# Patient Record
Sex: Female | Born: 1955 | Race: White | Marital: Married | Smoking: Former smoker
Health system: Southern US, Community
[De-identification: ages and names within clinical notes are randomized; demographics above are authoritative.]

## PROBLEM LIST (undated history)

## (undated) DIAGNOSIS — I10 Essential (primary) hypertension: Secondary | ICD-10-CM

## (undated) DIAGNOSIS — Z87442 Personal history of urinary calculi: Secondary | ICD-10-CM

## (undated) DIAGNOSIS — T148XXA Other injury of unspecified body region, initial encounter: Secondary | ICD-10-CM

## (undated) DIAGNOSIS — E039 Hypothyroidism, unspecified: Secondary | ICD-10-CM

## (undated) HISTORY — PX: CERVICAL SPINE SURGERY: SHX589

## (undated) HISTORY — PX: BACK SURGERY: SHX140

## (undated) HISTORY — PX: CHOLECYSTECTOMY: SHX55

## (undated) HISTORY — PX: BREAST BIOPSY: SHX20

## (undated) HISTORY — PX: ABDOMINAL HYSTERECTOMY: SHX81

---

## 2015-05-04 ENCOUNTER — Telehealth: Payer: Self-pay | Admitting: *Deleted

## 2015-05-05 NOTE — Telephone Encounter (Signed)
I called the patient and LVM advising she can take her gabapentin before EMG/NCV tomorrow.

## 2015-05-06 ENCOUNTER — Encounter: Payer: Managed Care, Other (non HMO) | Admitting: Neurology

## 2015-05-06 ENCOUNTER — Telehealth: Payer: Self-pay | Admitting: Neurology

## 2015-05-06 NOTE — Telephone Encounter (Signed)
I received a 3 way call from pt and [employer] today. Pt is scheduled for NCS and EMG at 4p today. Nancy Sims called from [place] with pt Nancy Sims on the phone. Nancy Sims, new pt,  was scheduled for ncs and emg and was told  she needed to call her insurance to verify with them what would be covered, by me when she was scheduled. Nancy Sims states she called our office and asked that our office fill out a predetermination form. She was called back by [facility] who told her she looked at the insurances web site and was under the impression she did not need to be pre-qualified for procedures. Nancy Sims did not know what a predetermination form was. While on the phone with Nancy Sims and Nancy Sims it was told that the pt should have called her referring physician to have the predetermination form filled out. Nancy Sims was told, by insurance, not all of the EMG would be covered with the diagnosis codes that were provided during her original referral. [employer] then stated that the referring physician could go online to a list of pre-qualified diagnosis codes. Nancy Sims was very upset with the whole ordeal. She felt that she was lied to and that we did not do our job. I told her that if she wanted to come in today that would be fine but,  both procedures would need to be done and the referring physician could call our office with new diagnosis codes. Nancy Sims with Nancy Sims spoke up and told the pt that she would need to have the predetermination form filled out first though, which would take more time than the pt had to be in our office for her appt. It was decided then that she would be r/s to March 20th. She understood that she will need to have the referring physician send new diagnosis codes to our office before then.

## 2015-06-15 ENCOUNTER — Encounter: Payer: Managed Care, Other (non HMO) | Admitting: Neurology

## 2015-06-29 NOTE — Telephone Encounter (Signed)
Patient called to inquire if Dr. Channing Muttersoy sent us updated diagnosis code(s) for upcoming NCS/EMG. Patient advised, per Kathie RhodesBetty, [facility] New Pt Coord., peripheral neuropathy is the diagnosis given to our office for this appointment, no specific code was given. Advised patient to contact Dr. Temple Pacinioy's office for codes. Patient states, she did call their office and was advised to call our office for codes. Patient transferred to [employer] Service[employer] Company[employer]ty's voicemail.

## 2015-07-03 ENCOUNTER — Encounter: Payer: Self-pay | Admitting: Neurology

## 2015-07-03 ENCOUNTER — Ambulatory Visit (INDEPENDENT_AMBULATORY_CARE_PROVIDER_SITE_OTHER): Payer: Self-pay | Admitting: Neurology

## 2015-07-03 ENCOUNTER — Telehealth: Payer: Self-pay | Admitting: Neurology

## 2015-07-03 DIAGNOSIS — M25569 Pain in unspecified knee: Secondary | ICD-10-CM

## 2015-07-03 NOTE — Telephone Encounter (Signed)
The patient arrived for the test, but then refused to have the testing procedure done. The patient left without having the procedure done.

## 2015-07-03 NOTE — Progress Notes (Signed)
The patient left without having testing procedure.

## 2016-11-09 ENCOUNTER — Other Ambulatory Visit: Payer: Self-pay | Admitting: Neurosurgery

## 2016-11-09 DIAGNOSIS — M4712 Other spondylosis with myelopathy, cervical region: Secondary | ICD-10-CM

## 2016-11-12 ENCOUNTER — Ambulatory Visit
Admission: RE | Admit: 2016-11-12 | Discharge: 2016-11-12 | Disposition: A | Discharge status: Home / Self Care | Payer: BLUE CROSS/BLUE SHIELD | Source: Ambulatory Visit | Attending: Neurosurgery | Admitting: Neurosurgery

## 2016-11-12 ENCOUNTER — Other Ambulatory Visit: Payer: Self-pay

## 2016-11-12 DIAGNOSIS — M4712 Other spondylosis with myelopathy, cervical region: Secondary | ICD-10-CM

## 2017-11-29 ENCOUNTER — Encounter (HOSPITAL_BASED_OUTPATIENT_CLINIC_OR_DEPARTMENT_OTHER): Payer: Self-pay

## 2017-11-29 ENCOUNTER — Other Ambulatory Visit: Payer: Self-pay

## 2017-11-30 ENCOUNTER — Encounter (HOSPITAL_BASED_OUTPATIENT_CLINIC_OR_DEPARTMENT_OTHER)
Admission: RE | Admit: 2017-11-30 | Discharge: 2017-11-30 | Disposition: A | Discharge status: Home / Self Care | Payer: BLUE CROSS/BLUE SHIELD | Source: Ambulatory Visit | Attending: Specialist | Admitting: Specialist

## 2017-11-30 DIAGNOSIS — Z01818 Encounter for other preprocedural examination: Secondary | ICD-10-CM | POA: Diagnosis not present

## 2017-11-30 LAB — BASIC METABOLIC PANEL
Anion gap: 10 (ref 5–15)
BUN: 9 mg/dL (ref 6–20)
CALCIUM: 9.6 mg/dL (ref 8.9–10.3)
CO2: 30 mmol/L (ref 22–32)
CREATININE: 0.71 mg/dL (ref 0.44–1.00)
Chloride: 101 mmol/L (ref 98–111)
GFR calc Af Amer: >60 mL/min (ref >60)
GLUCOSE: 92 mg/dL (ref 70–99)
Potassium: 4.3 mmol/L (ref 3.5–5.1)
Sodium: 141 mmol/L (ref 135–145)

## 2017-12-01 NOTE — H&P (Signed)
Nancy Sims is an 62 y.o. female.   Chief Complaint: Severe macromastia [id number] back aND SHOULDER PAIN INTERTRIGO  Past Medical History:  Diagnosis Date  . History of kidney stones    none recently  . Hypertension   . Hypothyroidism   . Nerve damage    neck and back, multiple surgeries    Past Surgical History:  Procedure Laterality Date  . ABDOMINAL HYSTERECTOMY    . BACK SURGERY    . BREAST BIOPSY     x3  . CERVICAL SPINE SURGERY     x2, last surgery oct 2018  . CHOLECYSTECTOMY      History reviewed. No pertinent family history. Social History:  reports that she quit smoking about 10 years ago. She has never used smokeless tobacco. She reports that she drinks alcohol. She reports that she does not use drugs.  Allergies:  Allergies  Allergen Reactions  . Morphine And Related Nausea And Vomiting    headache  . Adhesive [Tape] Rash    No medications prior to admission.    Results for orders placed or performed during the hospital encounter of 12/04/17 (from the past 48 hour(s))  Basic metabolic panel     Status: None   Collection Time: 11/30/17 10:30 AM  Result Value Ref Range   Sodium 141 135 - 145 mmol/L   Potassium 4.3 3.5 - 5.1 mmol/L   Chloride 101 98 - 111 mmol/L   CO2 30 22 - 32 mmol/L   Glucose, Bld 92 70 - 99 mg/dL   BUN 9 6 - 20 mg/dL   Creatinine, Ser 0.71 0.44 - 1.00 mg/dL   Calcium 9.6 8.9 - 10.3 mg/dL   GFR calc non Af Amer >60 >60 mL/min   GFR calc Af Amer >60 >60 mL/min    Comment: (NOTE) The eGFR has been calculated using the CKD EPI equation. This calculation has not been validated in all clinical situations. eGFR's persistently <60 mL/min signify possible Chronic Kidney Disease.    Anion gap 10 5 - 15    Comment: Performed at [facility] [address] [place], [place] [address]   No results found.  Review of Systems  HENT: Negative.   Eyes: Negative.   Respiratory: Negative.   Cardiovascular: Negative.    Gastrointestinal: Negative.   Genitourinary: Negative.   Musculoskeletal: Positive for back pain and neck pain.  Skin: Positive for rash.  Neurological: Negative.   Endo/Heme/Allergies: Negative.   Psychiatric/Behavioral: Negative.     Height 5' 3"  (1.6 m), weight 99.8 kg. Physical Exam  Constitutional: She appears well-developed.  HENT:  Head: Normocephalic.  Eyes: Pupils are equal, round, and reactive to light.  Neck: Normal range of motion.  Cardiovascular: Normal rate and regular rhythm.  Respiratory: Effort normal.  GI: Soft. Bowel sounds are normal.  Musculoskeletal: Normal range of motion.  Neurological: She is alert.  Skin: Skin is warm.  INCRESED AND SEVERE MACROMASTIA INTERTRIGO AND ACCESSORY BREAST TISSUE   Assessment/Plan SEVERE MACROMASTI FOR BILATERAL BREAST REDUCTIONS  Kloe Oates L, MD 12/01/2017, 12:40 PM

## 2017-12-04 ENCOUNTER — Encounter (HOSPITAL_BASED_OUTPATIENT_CLINIC_OR_DEPARTMENT_OTHER)
Admission: RE | Disposition: A | Discharge status: Home / Self Care | Payer: Self-pay | Source: Ambulatory Visit | Attending: Specialist

## 2017-12-04 ENCOUNTER — Ambulatory Visit (HOSPITAL_BASED_OUTPATIENT_CLINIC_OR_DEPARTMENT_OTHER)
Admission: RE | Admit: 2017-12-04 | Discharge: 2017-12-04 | Disposition: A | Discharge status: Home / Self Care | Payer: BLUE CROSS/BLUE SHIELD | Source: Ambulatory Visit | Attending: Specialist | Admitting: Specialist

## 2017-12-04 ENCOUNTER — Ambulatory Visit (HOSPITAL_BASED_OUTPATIENT_CLINIC_OR_DEPARTMENT_OTHER): Payer: BLUE CROSS/BLUE SHIELD | Admitting: Anesthesiology

## 2017-12-04 ENCOUNTER — Encounter (HOSPITAL_BASED_OUTPATIENT_CLINIC_OR_DEPARTMENT_OTHER): Payer: Self-pay | Admitting: Anesthesiology

## 2017-12-04 DIAGNOSIS — Z888 Allergy status to other drugs, medicaments and biological substances status: Secondary | ICD-10-CM | POA: Insufficient documentation

## 2017-12-04 DIAGNOSIS — M25511 Pain in right shoulder: Secondary | ICD-10-CM | POA: Insufficient documentation

## 2017-12-04 DIAGNOSIS — Z87891 Personal history of nicotine dependence: Secondary | ICD-10-CM | POA: Insufficient documentation

## 2017-12-04 DIAGNOSIS — Z885 Allergy status to narcotic agent status: Secondary | ICD-10-CM | POA: Insufficient documentation

## 2017-12-04 DIAGNOSIS — E039 Hypothyroidism, unspecified: Secondary | ICD-10-CM | POA: Insufficient documentation

## 2017-12-04 DIAGNOSIS — Q831 Accessory breast: Secondary | ICD-10-CM | POA: Diagnosis not present

## 2017-12-04 DIAGNOSIS — I10 Essential (primary) hypertension: Secondary | ICD-10-CM | POA: Diagnosis not present

## 2017-12-04 DIAGNOSIS — Z6838 Body mass index (BMI) 38.0-38.9, adult: Secondary | ICD-10-CM | POA: Insufficient documentation

## 2017-12-04 DIAGNOSIS — N6489 Other specified disorders of breast: Secondary | ICD-10-CM | POA: Insufficient documentation

## 2017-12-04 DIAGNOSIS — M25512 Pain in left shoulder: Secondary | ICD-10-CM | POA: Insufficient documentation

## 2017-12-04 DIAGNOSIS — M549 Dorsalgia, unspecified: Secondary | ICD-10-CM | POA: Diagnosis not present

## 2017-12-04 DIAGNOSIS — N62 Hypertrophy of breast: Secondary | ICD-10-CM | POA: Diagnosis present

## 2017-12-04 DIAGNOSIS — E669 Obesity, unspecified: Secondary | ICD-10-CM | POA: Insufficient documentation

## 2017-12-04 DIAGNOSIS — L304 Erythema intertrigo: Secondary | ICD-10-CM | POA: Diagnosis not present

## 2017-12-04 HISTORY — DX: Personal history of urinary calculi: Z87.442

## 2017-12-04 HISTORY — PX: BREAST REDUCTION SURGERY: SHX8

## 2017-12-04 HISTORY — DX: Hypothyroidism, unspecified: E03.9

## 2017-12-04 HISTORY — DX: Essential (primary) hypertension: I10

## 2017-12-04 HISTORY — DX: Other injury of unspecified body region, initial encounter: T14.8XXA

## 2017-12-04 HISTORY — PX: LIPOSUCTION: SHX10

## 2017-12-04 SURGERY — MAMMOPLASTY, REDUCTION
Anesthesia: General | Site: Breast | Laterality: Bilateral

## 2017-12-04 MED ORDER — FENTANYL CITRATE (PF) 100 MCG/2ML IJ SOLN
50.0000 ug | INTRAMUSCULAR | Status: AC | PRN
  Administered 2017-12-04: 50 ug via INTRAVENOUS
  Administered 2017-12-04 (×2): 25 ug via INTRAVENOUS

## 2017-12-04 MED ORDER — SODIUM BICARBONATE 4 % IV SOLN
INTRAVENOUS | Status: AC
  Filled 2017-12-04: qty 5

## 2017-12-04 MED ORDER — FENTANYL CITRATE (PF) 100 MCG/2ML IJ SOLN
INTRAMUSCULAR | Status: AC
  Filled 2017-12-04: qty 2

## 2017-12-04 MED ORDER — LIDOCAINE HCL 2 % IJ SOLN
INTRAMUSCULAR | Status: AC
  Filled 2017-12-04: qty 100

## 2017-12-04 MED ORDER — EPINEPHRINE PF 1 MG/ML IJ SOLN
INTRAMUSCULAR | Status: DC | PRN
  Administered 2017-12-04: 1 mg

## 2017-12-04 MED ORDER — PHENYLEPHRINE HCL 10 MG/ML IJ SOLN
INTRAMUSCULAR | Status: AC
  Filled 2017-12-04: qty 1

## 2017-12-04 MED ORDER — LIDOCAINE HCL (PF) 2 % IJ SOLN
INTRAMUSCULAR | Status: DC | PRN
  Administered 2017-12-04: 100 mL

## 2017-12-04 MED ORDER — SCOPOLAMINE 1 MG/3DAYS TD PT72: 1.0000 | MEDICATED_PATCH | Freq: Once | TRANSDERMAL | Status: DC | PRN

## 2017-12-04 MED ORDER — PROMETHAZINE HCL 25 MG/ML IJ SOLN: 6.2500 mg | INTRAMUSCULAR | Status: DC | PRN

## 2017-12-04 MED ORDER — ONDANSETRON HCL 4 MG/2ML IJ SOLN
INTRAMUSCULAR | Status: AC
  Filled 2017-12-04: qty 2

## 2017-12-04 MED ORDER — CEPHALEXIN 500 MG PO CAPS: 500.0000 mg | ORAL_CAPSULE | Freq: Four times a day (QID) | ORAL | 0 refills | Status: AC

## 2017-12-04 MED ORDER — SODIUM BICARBONATE 4 % IV SOLN
INTRAVENOUS | Status: DC | PRN
  Administered 2017-12-04: 5 mL

## 2017-12-04 MED ORDER — HYDROMORPHONE HCL 1 MG/ML IJ SOLN: 0.2500 mg | INTRAMUSCULAR | Status: DC | PRN

## 2017-12-04 MED ORDER — LACTATED RINGERS IV SOLN
INTRAVENOUS | Status: AC | PRN
  Administered 2017-12-04: 1000 mL

## 2017-12-04 MED ORDER — OXYCODONE-ACETAMINOPHEN 10-325 MG PO TABS: 1.0000 | ORAL_TABLET | Freq: Four times a day (QID) | ORAL | 0 refills | Status: AC | PRN

## 2017-12-04 MED ORDER — CHLORHEXIDINE GLUCONATE CLOTH 2 % EX PADS: 6.0000 | MEDICATED_PAD | Freq: Once | CUTANEOUS | Status: DC

## 2017-12-04 MED ORDER — OXYCODONE HCL 5 MG PO TABS: 5.0000 mg | ORAL_TABLET | Freq: Once | ORAL | Status: DC | PRN

## 2017-12-04 MED ORDER — PROPOFOL 10 MG/ML IV BOLUS
INTRAVENOUS | Status: DC | PRN
  Administered 2017-12-04: 200 mg via INTRAVENOUS

## 2017-12-04 MED ORDER — CEFAZOLIN SODIUM-DEXTROSE 2-4 GM/100ML-% IV SOLN
2.0000 g | INTRAVENOUS | Status: AC
  Administered 2017-12-04: 2 g via INTRAVENOUS

## 2017-12-04 MED ORDER — DEXAMETHASONE SODIUM PHOSPHATE 10 MG/ML IJ SOLN
INTRAMUSCULAR | Status: AC
  Filled 2017-12-04: qty 1

## 2017-12-04 MED ORDER — LACTATED RINGERS IV SOLN
INTRAVENOUS | Status: DC
  Administered 2017-12-04 (×2): via INTRAVENOUS

## 2017-12-04 MED ORDER — PROPOFOL 10 MG/ML IV BOLUS
INTRAVENOUS | Status: AC
  Filled 2017-12-04: qty 40

## 2017-12-04 MED ORDER — MIDAZOLAM HCL 2 MG/2ML IJ SOLN
1.0000 mg | INTRAMUSCULAR | Status: DC | PRN
  Administered 2017-12-04 (×2): 1 mg via INTRAVENOUS

## 2017-12-04 MED ORDER — LIDOCAINE 2% (20 MG/ML) 5 ML SYRINGE
INTRAMUSCULAR | Status: AC
  Filled 2017-12-04: qty 5

## 2017-12-04 MED ORDER — PHENYLEPHRINE 40 MCG/ML (10ML) SYRINGE FOR IV PUSH (FOR BLOOD PRESSURE SUPPORT)
PREFILLED_SYRINGE | INTRAVENOUS | Status: DC | PRN
  Administered 2017-12-04 (×3): 80 ug via INTRAVENOUS

## 2017-12-04 MED ORDER — SODIUM CHLORIDE 0.9 % IV SOLN
INTRAVENOUS | Status: DC | PRN
  Administered 2017-12-04: 50 ug/min via INTRAVENOUS

## 2017-12-04 MED ORDER — DEXAMETHASONE SODIUM PHOSPHATE 10 MG/ML IJ SOLN
INTRAMUSCULAR | Status: DC | PRN
  Administered 2017-12-04: 10 mg via INTRAVENOUS

## 2017-12-04 MED ORDER — LIDOCAINE-EPINEPHRINE 0.5 %-1:200000 IJ SOLN
INTRAMUSCULAR | Status: AC
  Filled 2017-12-04: qty 2

## 2017-12-04 MED ORDER — OXYCODONE HCL 5 MG/5ML PO SOLN: 5.0000 mg | Freq: Once | ORAL | Status: DC | PRN

## 2017-12-04 MED ORDER — CEFAZOLIN SODIUM-DEXTROSE 2-4 GM/100ML-% IV SOLN
INTRAVENOUS | Status: AC
  Filled 2017-12-04: qty 100

## 2017-12-04 MED ORDER — EPINEPHRINE 30 MG/30ML IJ SOLN
INTRAMUSCULAR | Status: AC
  Filled 2017-12-04: qty 1

## 2017-12-04 MED ORDER — MIDAZOLAM HCL 2 MG/2ML IJ SOLN
INTRAMUSCULAR | Status: AC
  Filled 2017-12-04: qty 2

## 2017-12-04 MED ORDER — MEPERIDINE HCL 25 MG/ML IJ SOLN: 6.2500 mg | INTRAMUSCULAR | Status: DC | PRN

## 2017-12-04 MED ORDER — LIDOCAINE 2% (20 MG/ML) 5 ML SYRINGE
INTRAMUSCULAR | Status: DC | PRN
  Administered 2017-12-04: 100 mg via INTRAVENOUS

## 2017-12-04 MED ORDER — ONDANSETRON HCL 4 MG/2ML IJ SOLN
INTRAMUSCULAR | Status: DC | PRN
  Administered 2017-12-04: 4 mg via INTRAVENOUS

## 2017-12-04 SURGICAL SUPPLY — 88 items
BAG DECANTER FOR FLEXI CONT (MISCELLANEOUS) ×2 IMPLANT
BENZOIN TINCTURE PRP APPL 2/3 (GAUZE/BANDAGES/DRESSINGS) ×4 IMPLANT
BINDER BREAST XLRG (GAUZE/BANDAGES/DRESSINGS) IMPLANT
BINDER BREAST XXLRG (GAUZE/BANDAGES/DRESSINGS) ×2 IMPLANT
BLADE KNIFE  20 PERSONNA (BLADE)
BLADE KNIFE 20 PERSONNA (BLADE) IMPLANT
BLADE KNIFE PERSONA 10 (BLADE) ×8 IMPLANT
BLADE KNIFE PERSONA 15 (BLADE) ×2 IMPLANT
BNDG COHESIVE 4X5 TAN STRL (GAUZE/BANDAGES/DRESSINGS) IMPLANT
BNDG GAUZE ELAST 4 BULKY (GAUZE/BANDAGES/DRESSINGS) IMPLANT
CANISTER SUCT 1200ML W/VALVE (MISCELLANEOUS) ×2 IMPLANT
CLEANER CAUTERY TIP 5X5 PAD (MISCELLANEOUS) IMPLANT
COVER BACK TABLE 60X90IN (DRAPES) ×2 IMPLANT
COVER MAYO STAND STRL (DRAPES) ×2 IMPLANT
DECANTER SPIKE VIAL GLASS SM (MISCELLANEOUS) IMPLANT
DRAIN CHANNEL 10F 3/8 F FF (DRAIN) ×4 IMPLANT
DRAPE LAPAROSCOPIC ABDOMINAL (DRAPES) ×2 IMPLANT
DRAPE LAPAROTOMY 100X72 PEDS (DRAPES) IMPLANT
DRAPE U-SHAPE 76X120 STRL (DRAPES) IMPLANT
DRSG PAD ABDOMINAL 8X10 ST (GAUZE/BANDAGES/DRESSINGS) ×8 IMPLANT
ELECT REM PT RETURN 9FT ADLT (ELECTROSURGICAL) ×2
ELECTRODE REM PT RTRN 9FT ADLT (ELECTROSURGICAL) ×1 IMPLANT
EVACUATOR SILICONE 100CC (DRAIN) ×4 IMPLANT
GAUZE SPONGE 4X4 12PLY STRL (GAUZE/BANDAGES/DRESSINGS) ×4 IMPLANT
GAUZE SPONGE 4X4 12PLY STRL LF (GAUZE/BANDAGES/DRESSINGS) IMPLANT
GAUZE XEROFORM 1X8 LF (GAUZE/BANDAGES/DRESSINGS) IMPLANT
GAUZE XEROFORM 5X9 LF (GAUZE/BANDAGES/DRESSINGS) ×4 IMPLANT
GLOVE BIO SURGEON STRL SZ 6.5 (GLOVE) ×2 IMPLANT
GLOVE BIOGEL M STRL SZ7.5 (GLOVE) ×2 IMPLANT
GLOVE BIOGEL PI IND STRL 8 (GLOVE) ×1 IMPLANT
GLOVE BIOGEL PI INDICATOR 8 (GLOVE) ×1
GLOVE ECLIPSE 7.0 STRL STRAW (GLOVE) ×2 IMPLANT
GOWN STRL REUS W/ TWL LRG LVL3 (GOWN DISPOSABLE) IMPLANT
GOWN STRL REUS W/ TWL XL LVL3 (GOWN DISPOSABLE) ×2 IMPLANT
GOWN STRL REUS W/TWL LRG LVL3 (GOWN DISPOSABLE)
GOWN STRL REUS W/TWL XL LVL3 (GOWN DISPOSABLE) ×2
IV LACTATED RINGERS 1000ML (IV SOLUTION) ×2 IMPLANT
IV NS 500ML (IV SOLUTION)
IV NS 500ML BAXH (IV SOLUTION) IMPLANT
LINER CANISTER 1000CC FLEX (MISCELLANEOUS) ×2 IMPLANT
MARKER SKIN DUAL TIP RULER LAB (MISCELLANEOUS) ×2 IMPLANT
NDL SAFETY ECLIPSE 18X1.5 (NEEDLE) ×1 IMPLANT
NEEDLE HYPO 18GX1.5 SHARP (NEEDLE) ×1
NEEDLE HYPO 25X1 1.5 SAFETY (NEEDLE) IMPLANT
NEEDLE SPNL 18GX3.5 QUINCKE PK (NEEDLE) IMPLANT
NS IRRIG 1000ML POUR BTL (IV SOLUTION) ×2 IMPLANT
PACK BASIN DAY SURGERY FS (CUSTOM PROCEDURE TRAY) ×2 IMPLANT
PAD ALCOHOL SWAB (MISCELLANEOUS) ×4 IMPLANT
PAD CLEANER CAUTERY TIP 5X5 (MISCELLANEOUS)
PEN SKIN MARKING BROAD TIP (MISCELLANEOUS) ×2 IMPLANT
PILLOW FOAM RUBBER ADULT (PILLOWS) ×2 IMPLANT
PIN SAFETY STERILE (MISCELLANEOUS) ×2 IMPLANT
SHEET MEDIUM DRAPE 40X70 STRL (DRAPES) IMPLANT
SHEETING SILICONE GEL EPI DERM (MISCELLANEOUS) IMPLANT
SLEEVE SCD COMPRESS KNEE MED (MISCELLANEOUS) ×2 IMPLANT
SPECIMEN JAR MEDIUM (MISCELLANEOUS) IMPLANT
SPECIMEN JAR X LARGE (MISCELLANEOUS) ×4 IMPLANT
SPONGE LAP 18X18 RF (DISPOSABLE) ×8 IMPLANT
STOCKINETTE 4X48 STRL (DRAPES) IMPLANT
STOCKINETTE IMPERVIOUS LG (DRAPES) IMPLANT
STRIP CLOSURE SKIN 1/8X3 (GAUZE/BANDAGES/DRESSINGS) IMPLANT
STRIP SUTURE WOUND CLOSURE 1/2 (SUTURE) ×10 IMPLANT
SUT ETHILON 3 0 PS 1 (SUTURE) IMPLANT
SUT MNCRL AB 3-0 PS2 18 (SUTURE) ×12 IMPLANT
SUT MON AB 2-0 CT1 36 (SUTURE) IMPLANT
SUT MON AB 5-0 PS2 18 (SUTURE) ×4 IMPLANT
SUT PROLENE 3 0 PS 2 (SUTURE) ×12 IMPLANT
SUT PROLENE 4 0 PS 2 18 (SUTURE) IMPLANT
SUT VIC AB 0 CT1 27 (SUTURE)
SUT VIC AB 0 CT1 27XBRD ANBCTR (SUTURE) IMPLANT
SUT VLOC 180 0 24IN GS25 (SUTURE) IMPLANT
SUT VLOC 90 P-14 23 (SUTURE) ×4 IMPLANT
SYR 20CC LL (SYRINGE) IMPLANT
SYR 50ML LL SCALE MARK (SYRINGE) ×4 IMPLANT
SYR CONTROL 10ML LL (SYRINGE) IMPLANT
SYR TB 1ML LL NO SAFETY (SYRINGE) IMPLANT
TAPE HYPAFIX 6X30 (GAUZE/BANDAGES/DRESSINGS) ×2 IMPLANT
TAPE MEASURE 72IN RETRACT (INSTRUMENTS) ×1
TAPE MEASURE LINEN 72IN RETRCT (INSTRUMENTS) ×1 IMPLANT
TAPE PAPER 1X10 WHT MICROPORE (GAUZE/BANDAGES/DRESSINGS) IMPLANT
TOWEL GREEN STERILE FF (TOWEL DISPOSABLE) ×4 IMPLANT
TOWEL OR NON WOVEN STRL DISP B (DISPOSABLE) ×2 IMPLANT
TUBE CONNECTING 20X1/4 (TUBING) ×2 IMPLANT
TUBING INFILTRATION IT-10001 (TUBING) ×2 IMPLANT
TUBING SET GRADUATE ASPIR 12FT (MISCELLANEOUS) ×2 IMPLANT
UNDERPAD 30X30 (UNDERPADS AND DIAPERS) ×4 IMPLANT
VAC PENCILS W/TUBING CLEAR (MISCELLANEOUS) ×2 IMPLANT
YANKAUER SUCT BULB TIP NO VENT (SUCTIONS) ×2 IMPLANT

## 2017-12-04 NOTE — Anesthesia Postprocedure Evaluation (Signed)
Anesthesia Post Note  Patient: Nancy Sims  Procedure(s) Performed: MAMMARY REDUCTION  (BREAST) BILATERAL (Bilateral Breast) LIPOSUCTION AVAILABLE (Bilateral Breast)     Patient location during evaluation: PACU Anesthesia Type: General Level of consciousness: awake and alert Pain management: pain level controlled Vital Signs Assessment: post-procedure vital signs reviewed and stable Respiratory status: spontaneous breathing, nonlabored ventilation and respiratory function stable Cardiovascular status: blood pressure returned to baseline and stable Postop Assessment: no apparent nausea or vomiting Anesthetic complications: no    Last Vitals:  Vitals:   12/04/17 1130 12/04/17 1213  BP: 127/79 129/81  Pulse: 83 86  Resp: 14 20  Temp:  36.5 C  SpO2: 93% 95%    Last Pain:  Vitals:   12/04/17 1213  TempSrc: Oral  PainSc: 0-No pain                 Lowella Curb

## 2017-12-04 NOTE — Anesthesia Preprocedure Evaluation (Signed)
Anesthesia Evaluation  Patient identified by MRN, date of birth, ID band Patient awake    Reviewed: Allergy & Precautions, NPO status , Patient's Chart, lab work & pertinent test results  Airway Mallampati: II  TM Distance: >3 FB Neck ROM: Full    Dental no notable dental hx.    Pulmonary neg pulmonary ROS, former smoker,    Pulmonary exam normal breath sounds clear to auscultation       Cardiovascular hypertension, Pt. on medications negative cardio ROS Normal cardiovascular exam Rhythm:Regular Rate:Normal     Neuro/Psych negative neurological ROS  negative psych ROS   GI/Hepatic negative GI ROS, Neg liver ROS,   Endo/Other  negative endocrine ROSHypothyroidism   Renal/GU negative Renal ROS  negative genitourinary   Musculoskeletal negative musculoskeletal ROS (+)   Abdominal (+) + obese,   Peds negative pediatric ROS (+)  Hematology negative hematology ROS (+)   Anesthesia Other Findings   Reproductive/Obstetrics negative OB ROS                             Anesthesia Physical Anesthesia Plan  ASA: II  Anesthesia Plan: General   Post-op Pain Management:    Induction: Intravenous  PONV Risk Score and Plan: 3 and Ondansetron, Dexamethasone and Midazolam  Airway Management Planned: Oral ETT  Additional Equipment:   Intra-op Plan:   Post-operative Plan: Extubation in OR  Informed Consent: I have reviewed the patients History and Physical, chart, labs and discussed the procedure including the risks, benefits and alternatives for the proposed anesthesia with the patient or authorized representative who has indicated his/her understanding and acceptance.   Dental advisory given  Plan Discussed with: CRNA  Anesthesia Plan Comments:         Anesthesia Quick Evaluation

## 2017-12-04 NOTE — Op Note (Signed)
NAME: Nancy Sims, Nancy Sims MEDICAL RECORD [id number] ACCOUNT [id number] DATE OF BIRTH:08/25/1955 FACILITY: [facility] LOCATION: [facility] PHYSICIAN:Zaiah Eckerson Preston Fleeting, MD  OPERATIVE REPORT  DATE OF PROCEDURE:  12/04/2017  The patient with severe-severe macromastia back and shoulder pain secondary to large pendulous breasts, intertriginous changes, increasing excessive breast tissue bilaterally.  PROCEDURES DONE:  Bilateral breast reductions, excision of severe accessory breast tissue.  ANESTHESIA:  General.  PREOPERATIVE DESCRIPTION OF PROCEDURE:  The patient was set up and drawn for the reduction mammoplasty using the inferior pedicle technique.  She measured over 40 cm from the suprasternal notch to nipple areolar complexes.  After this, she was taken to  the operating room and placed on the operating table in supine position, was given adequate general anesthesia, intubated orally.  Prep was done to the chest, breast areas with Hibiclens soap and solution, walled off with sterile towels and drapes so as  to make a sterile field.  Tumescent was injected 5 mL per side and allowed to sit up.  The wounds were scored with a #15 blade and the skin over the inferior pedicles were dissected with a #10 blade.  Medial and lateral fatty dermal pedicles were excised  down the underlying pectoralis major fascia out laterally.  More breast tissue was removed in the lateral axillary areas.  Liposuction was fashioned using a [place] catheter 4 cm, removing over 250 mL of excessive breast tissue.  The inferior pedicle was  then released and proper hemostasis using the Bovie on coagulation.  The flaps were transposed and stayed with 3-0 Prolene suture.  Subcutaneous closure done with 3-0 Monocryl x2 levels and a running subcuticular stitch of 3-0 Monocryl and 5-0 Monocryl  throughout the inverted T.  The wounds were drained with #10 fully fluted Blake drains bilaterally.  Same procedure was carried out on both sides.     ESTIMATED BLOOD LOSS:  200 mL.  COMPLICATIONS:  None.  The nipple areolar complex was reexamined showing excellent blood supply.  The patient has ALLERGIES TO TAPE, so no Steri-Strips were done but antibiotic bacitracin was applied to the areas, 4 x 4, ABDs, Hypafix tape and Ace wraps.   She tolerated the procedure very well, was taken to recovery in excellent condition.  COMPLICATIONS:  None.  TN/NUANCE  D:12/04/2017 T:12/04/2017 JOB:[id number]

## 2017-12-04 NOTE — Discharge Instructions (Signed)
About my Jackson-Pratt Bulb Drain ? ?What is a Jackson-Pratt bulb? ?A Jackson-Pratt is a soft, round device used to collect drainage. It is connected to a long, thin drainage catheter, which is held in place by one or two small stiches near your surgical incision site. When the bulb is squeezed, it forms a vacuum, forcing the drainage to empty into the bulb. ? ?Emptying the Jackson-Pratt bulb- ?To empty the bulb: ?1. Release the plug on the top of the bulb. ?2. Pour the bulb's contents into a measuring container which your nurse will provide. ?3. Record the time emptied and amount of drainage. Empty the drain(s) as often as your     doctor or nurse recommends. ? ?Date                  Time                    Amount (Drain 1)                 Amount (Drain 2) ? ?_____________________________________________________________________ ? ?_____________________________________________________________________ ? ?_____________________________________________________________________ ? ?_____________________________________________________________________ ? ?_____________________________________________________________________ ? ?_____________________________________________________________________ ? ?_____________________________________________________________________ ? ?_____________________________________________________________________ ? ?Squeezing the Jackson-Pratt Bulb- ?To squeeze the bulb: ?1. Make sure the plug at the top of the bulb is open. ?2. Squeeze the bulb tightly in your fist. You will hear air squeezing from the bulb. ?3. Replace the plug while the bulb is squeezed. ?4. Use a safety pin to attach the bulb to your clothing. This will keep the catheter from     pulling at the bulb insertion site. ? ?When to call your doctor- ?Call your doctor if: ?Drain site becomes red, swollen or hot. ?You have a fever greater than 101 degrees F. ?There is oozing at the drain site. ?Drain falls out (apply a guaze bandage  over the drain hole and secure it with tape). ?Drainage increases daily not related to activity patterns. (You will usually have more drainage when you are active than when you are resting.) ?Drainage has a bad odor. ? ? ?Post Anesthesia Home Care Instructions ? ?Activity: ?Get plenty of rest for the remainder of the day. A responsible individual must stay with you for 24 hours following the procedure.  ?For the next 24 hours, DO NOT: ?-Drive a car ?-Operate machinery ?-Drink alcoholic beverages ?-Take any medication unless instructed by your physician ?-Make any legal decisions or sign important papers. ? ?Meals: ?Start with liquid foods such as gelatin or soup. Progress to regular foods as tolerated. Avoid greasy, spicy, heavy foods. If nausea and/or vomiting occur, drink only clear liquids until the nausea and/or vomiting subsides. Call your physician if vomiting continues. ? ?Special Instructions/Symptoms: ?Your throat may feel dry or sore from the anesthesia or the breathing tube placed in your throat during surgery. If this causes discomfort, gargle with warm salt water. The discomfort should disappear within 24 hours. ? ?If you had a scopolamine patch placed behind your ear for the management of post- operative nausea and/or vomiting: ? ?1. The medication in the patch is effective for 72 hours, after which it should be removed.  Wrap patch in a tissue and discard in the trash. Wash hands thoroughly with soap and water. ?2. You may remove the patch earlier than 72 hours if you experience unpleasant side effects which may include dry mouth, dizziness or visual disturbances. ?3. Avoid touching the patch. Wash your hands with soap and water after contact with the patch. ? ? ?    ?

## 2017-12-04 NOTE — Brief Op Note (Signed)
12/04/2017  10:15 AM  PATIENT:  Nancy Sims  62 y.o. female  PRE-OPERATIVE DIAGNOSIS:  severe macromastia  POST-OPERATIVE DIAGNOSIS:  severe macromastia  PROCEDURE:  Procedure(s): MAMMARY REDUCTION  (BREAST) BILATERAL (Bilateral) LIPOSUCTION AVAILABLE (Bilateral)  SURGEON:  Surgeon(s) and Role:    * Louisa Second, MD - Primary  PHYSICIAN ASSISTANT:   ASSISTANTS: none   ANESTHESIA:   general  EBL:  150 mL   BLOOD ADMINISTERED:none  DRAINS: Penrose drain in the   jP DRAINS X 2   LOCAL MEDICATIONS USED:  LIDOCAINE   SPECIMEN:  Excision  DISPOSITION OF SPECIMEN:  PATHOLOGY  COUNTS:  YES  TOURNIQUET:  * No tourniquets in log *  DICTATION: .Other Dictation: Dictation Number [id number]  PLAN OF CARE: Discharge to home after PACU  PATIENT DISPOSITION:  PACU - hemodynamically stable.   Delay start of Pharmacological VTE agent (>24hrs) due to surgical blood loss or risk of bleeding: yes

## 2017-12-04 NOTE — Transfer of Care (Signed)
Immediate Anesthesia Transfer of Care Note  Patient: Nancy Sims  Procedure(s) Performed: MAMMARY REDUCTION  (BREAST) BILATERAL (Bilateral Breast) LIPOSUCTION AVAILABLE (Bilateral Breast)  Patient Location: PACU  Anesthesia Type:General  Level of Consciousness: drowsy  Airway & Oxygen Therapy: Patient Spontanous Breathing and Patient connected to face mask oxygen  Post-op Assessment: Report given to RN  Post vital signs: Reviewed and stable  Last Vitals:  Vitals Value Taken Time  BP 115/70 12/04/2017 10:30 AM  Temp    Pulse 85 12/04/2017 10:31 AM  Resp 14 12/04/2017 10:31 AM  SpO2 99 % 12/04/2017 10:31 AM  Vitals shown include unvalidated device data.  Last Pain:  Vitals:   12/04/17 0644  TempSrc: Oral  PainSc: 2          Complications: No apparent anesthesia complications

## 2017-12-04 NOTE — Anesthesia Procedure Notes (Signed)
Procedure Name: LMA Insertion Date/Time: 12/04/2017 7:43 AM Performed by: Briant Sites, CRNA Pre-anesthesia Checklist: Patient identified, Emergency Drugs available, Suction available and Patient being monitored Patient Re-evaluated:Patient Re-evaluated prior to induction Oxygen Delivery Method: Circle system utilized Preoxygenation: Pre-oxygenation with 100% oxygen Induction Type: IV induction Ventilation: Mask ventilation without difficulty LMA: LMA inserted LMA Size: 4.0 Number of attempts: 1 Airway Equipment and Method: Bite block Placement Confirmation: positive ETCO2 Tube secured with: Tape Dental Injury: Teeth and Oropharynx as per pre-operative assessment

## 2017-12-05 ENCOUNTER — Encounter (HOSPITAL_BASED_OUTPATIENT_CLINIC_OR_DEPARTMENT_OTHER): Payer: Self-pay | Admitting: Specialist

## 2019-02-25 IMAGING — MR MR CERVICAL SPINE W/O CM
4 of 5 series · 28 of 48 positions shown · non-contrast
Comparison: Radiography 02/13/2014

CLINICAL DATA: Previous cervical fusion. Neck pain. Numbness in
weakness of the left side affecting the shoulder, arm and leg.

EXAM:
MRI CERVICAL SPINE WITHOUT CONTRAST
TECHNIQUE: Multiplanar, multisequence MR imaging of the cervical spine was
performed. No intravenous contrast was administered.

[Series 2: STIR · sagittal · 3.0mm · 0.82mm/px · 6 of 12 slices shown]
[im 1/12]
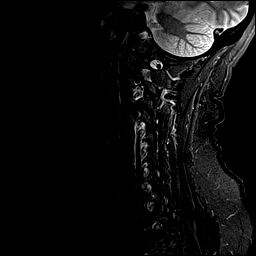
[im 3/12]
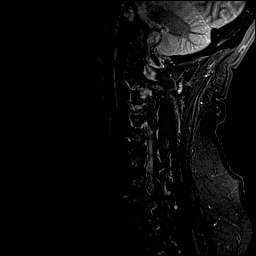
[im 5/12]
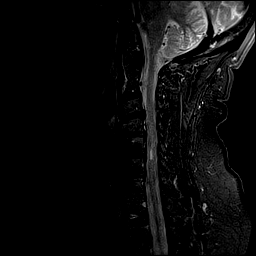
[im 7/12]
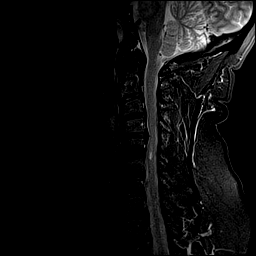
[im 9/12]
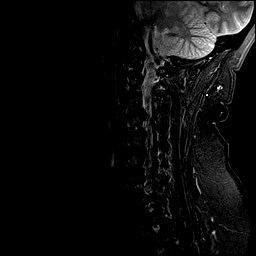
[im 12/12]
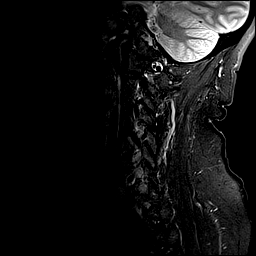

[Series 3: T2 · sagittal · 3.0mm · 0.41mm/px · 7 of 12 slices shown (1 of 2)]
[im 1/12]
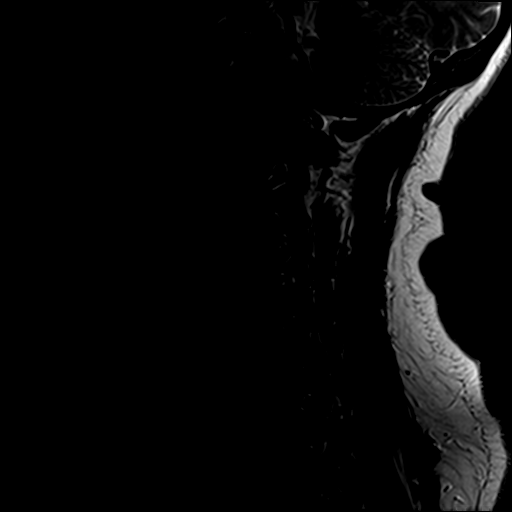
[im 2/12]
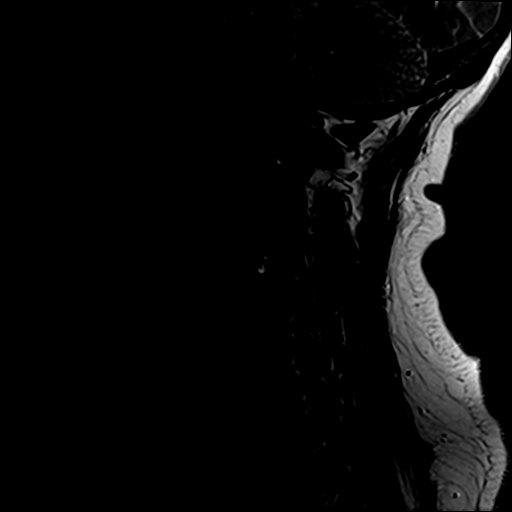
[im 4/12]
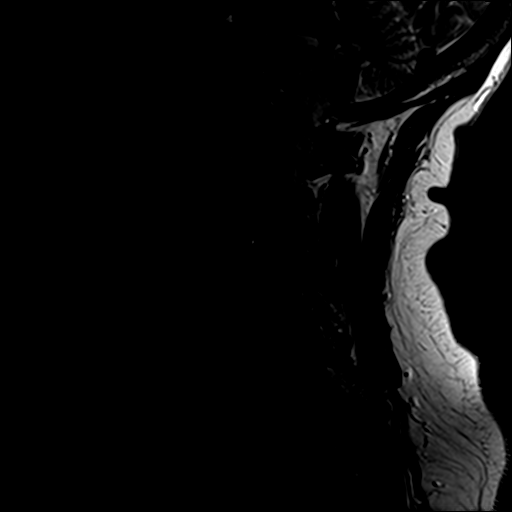
[im 6/12]
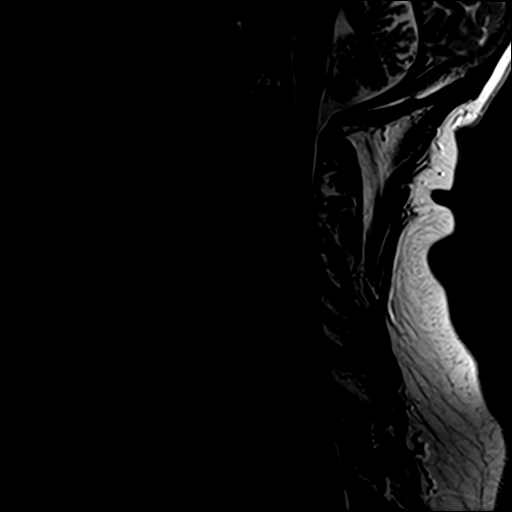
[im 8/12]
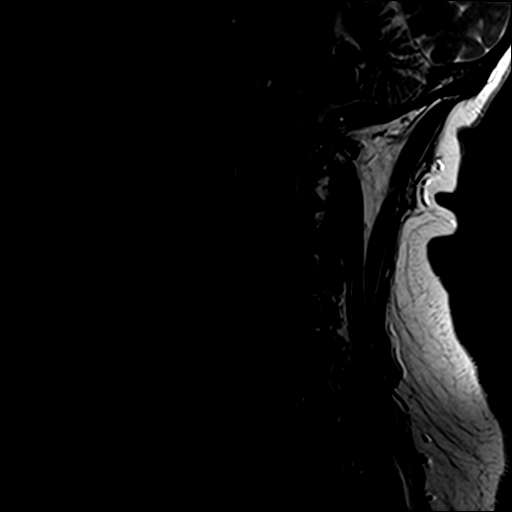
[im 10/12]
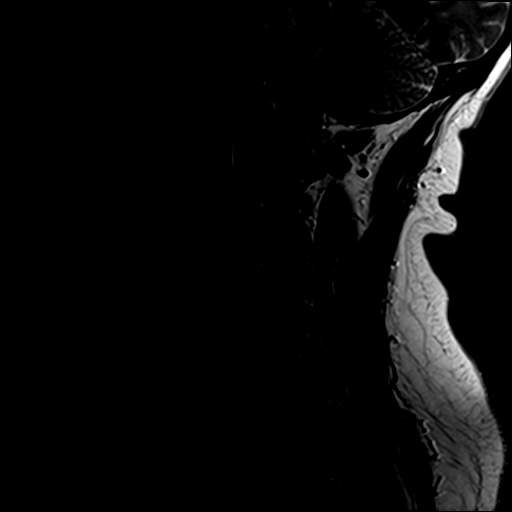
[im 12/12]
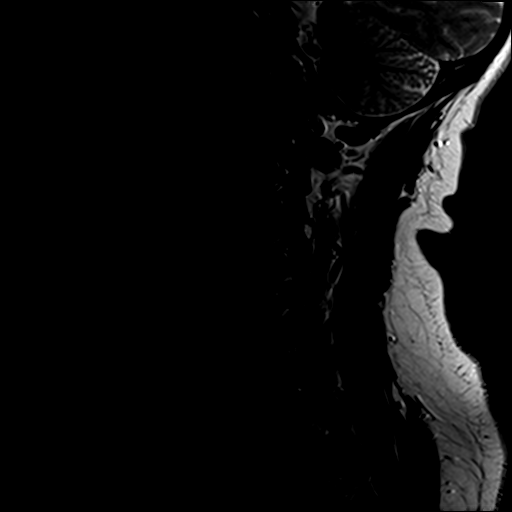

[Series 4: T1 · sagittal · 3.0mm · 0.41mm/px · 7 of 12 slices shown]
[im 1/12]
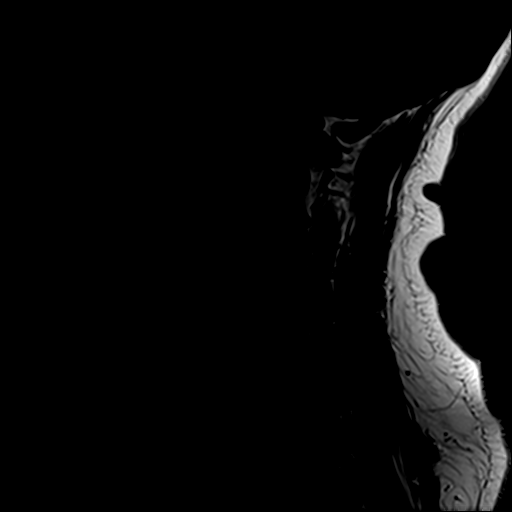
[im 2/12]
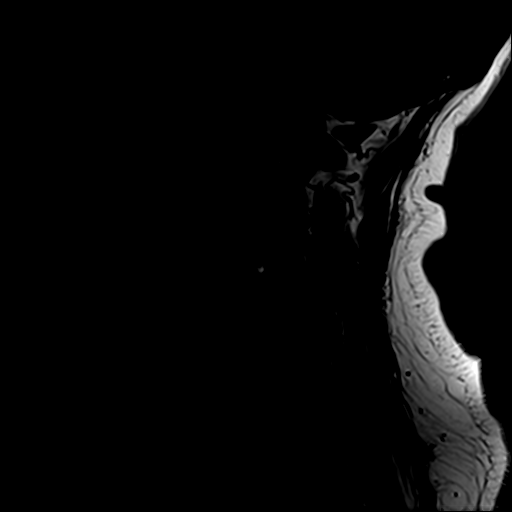
[im 4/12]
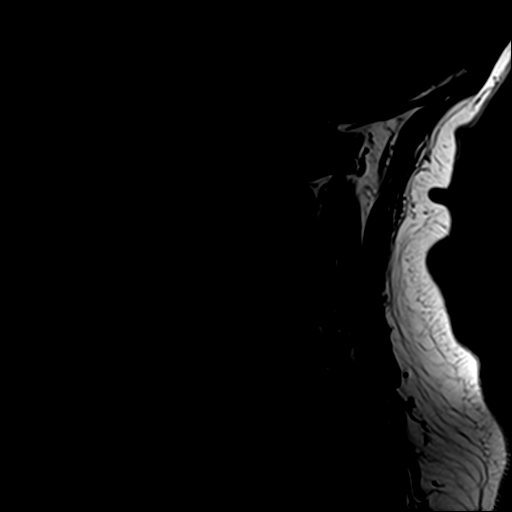
[im 6/12]
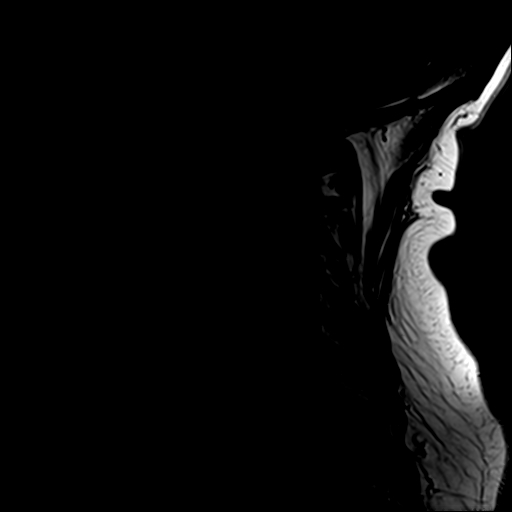
[im 8/12]
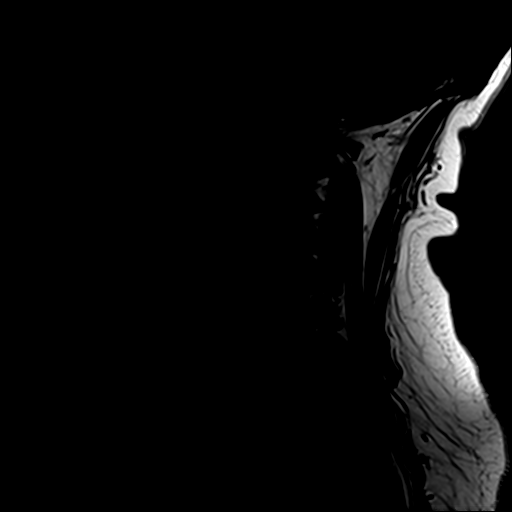
[im 10/12]
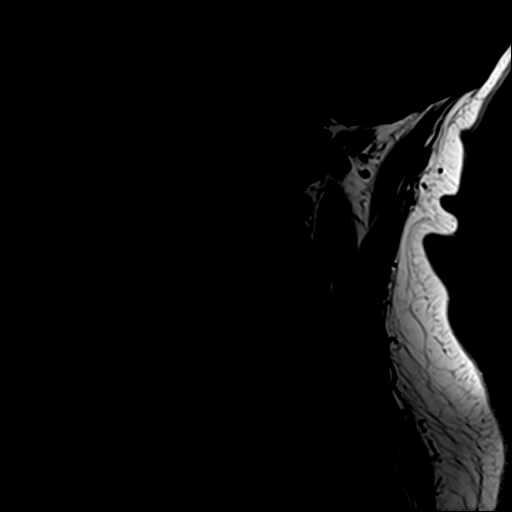
[im 12/12]
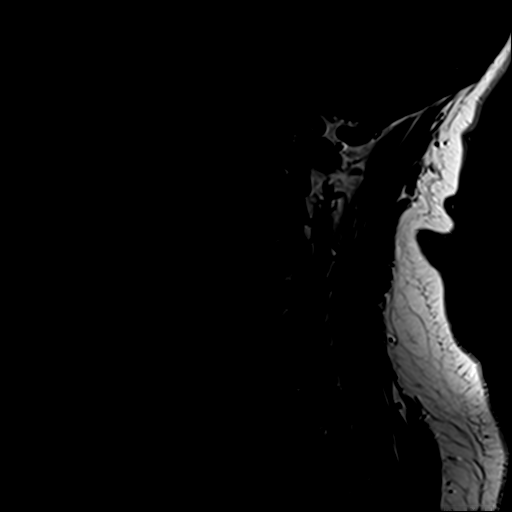

[Series 6: T2 · axial · 3.0mm · 0.70mm/px · z∈[-50,+36]mm · 8 of 26 slices shown (2 of 2)]
[im 1/26]
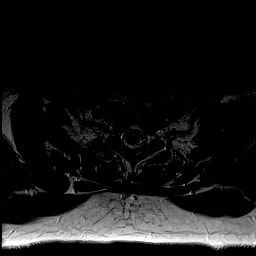
[im 4/26]
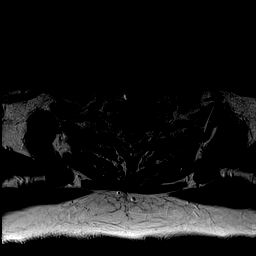
[im 8/26]
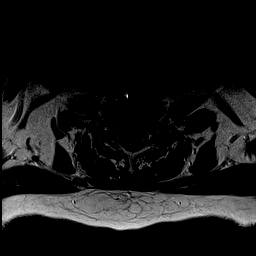
[im 12/26]
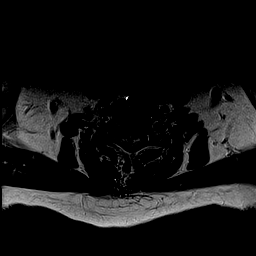
[im 14/26]
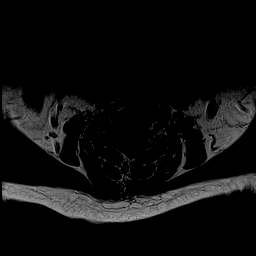
[im 18/26]
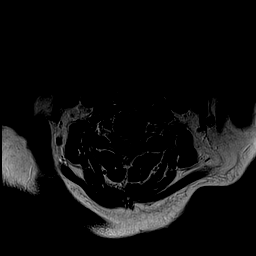
[im 22/26]
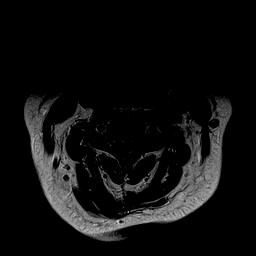
[im 26/26]
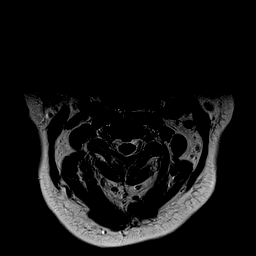

[28 of 48 positions shown; findings below may reference images not displayed]

FINDINGS: Alignment: Straightening of the normal cervical lordosis.

Vertebrae: Previous ACDF C5-C7.

Cord: Chronic appearing gliosis of the cord with some cord atrophy
at the C5-6 level.

Posterior Fossa, vertebral arteries, paraspinal tissues: Negative

Disc levels:

Foramen magnum is widely patent. Ordinary osteoarthritis at the C1-2
articulation without encroachment upon the neural structures.

C2-3:  Small central disc bulge.  No stenosis or neural compression.

C3-4: Broad-based, centrally predominant disc herniation effaces the
ventral subarachnoid space but does not compress the cord. AP
diameter of the canal this level measures 8 mm. Bilateral foraminal
encroachment by osteophytes could affect either C4 nerve.

C4-5: Broad-based disc herniation effaces the ventral subarachnoid
space and indents the ventral cord. AP diameter of the canal as
narrow as 7.8 mm. No foraminal stenosis.

C5 through C7: Previous ACDF has a good appearance. Wide patency of
the canal and foramina. Chronic myelomalacia at C5-6 as noted above.

C7-T1:  Normal interspace.
IMPRESSION: Good appearance in the fusion segment from C5-C7. Wide patency of
the canal and foramina. Chronic myelomalacia of the cord at the C5-6
level.

Broad-based, centrally predominant disc herniation at C3-4.
Narrowing of the ventral subarachnoid space with AP diameter of the
canal as narrow as 8 mm. Foraminal encroachment bilaterally could
affect either C4 nerves.

C4-5: Shallow broad-based disc protrusion slightly more prominent
towards the right. Effacement of the ventral subarachnoid space with
slight indentation of the cord. AP diameter as narrow as 7.8 mm.
Foramina appear sufficiently patent.
